# Patient Record
Sex: Female | Born: 1970 | Race: White | Hispanic: No | Marital: Single | Smoking: Never smoker
Health system: Southern US, Community
[De-identification: ages and names within clinical notes are randomized; demographics above are authoritative.]

## PROBLEM LIST (undated history)

## (undated) DIAGNOSIS — M797 Fibromyalgia: Secondary | ICD-10-CM

## (undated) DIAGNOSIS — K219 Gastro-esophageal reflux disease without esophagitis: Secondary | ICD-10-CM

## (undated) DIAGNOSIS — W3400XA Accidental discharge from unspecified firearms or gun, initial encounter: Secondary | ICD-10-CM

## (undated) HISTORY — PX: DIAPHRAGM SURGERY: SHX612

## (undated) HISTORY — PX: TONSILLECTOMY: SUR1361

## (undated) HISTORY — PX: TUBAL LIGATION: SHX77

---

## 2012-04-21 ENCOUNTER — Encounter (HOSPITAL_COMMUNITY): Payer: Self-pay | Admitting: *Deleted

## 2012-04-21 ENCOUNTER — Emergency Department (HOSPITAL_COMMUNITY)
Admission: EM | Admit: 2012-04-21 | Discharge: 2012-04-21 | Disposition: A | Discharge status: Home / Self Care | Payer: Self-pay | Attending: Emergency Medicine | Admitting: Emergency Medicine

## 2012-04-21 DIAGNOSIS — K219 Gastro-esophageal reflux disease without esophagitis: Secondary | ICD-10-CM | POA: Insufficient documentation

## 2012-04-21 DIAGNOSIS — K047 Periapical abscess without sinus: Secondary | ICD-10-CM | POA: Insufficient documentation

## 2012-04-21 HISTORY — DX: Fibromyalgia: M79.7

## 2012-04-21 HISTORY — DX: Accidental discharge from unspecified firearms or gun, initial encounter: W34.00XA

## 2012-04-21 HISTORY — DX: Gastro-esophageal reflux disease without esophagitis: K21.9

## 2012-04-21 MED ORDER — CLINDAMYCIN HCL 150 MG PO CAPS: 150.0000 mg | ORAL_CAPSULE | Freq: Four times a day (QID) | ORAL | Status: AC

## 2012-04-21 MED ORDER — TRAMADOL HCL 50 MG PO TABS: 50.0000 mg | ORAL_TABLET | Freq: Once | ORAL | Status: AC

## 2012-04-21 MED ORDER — TRAMADOL HCL 50 MG PO TABS
50.0000 mg | ORAL_TABLET | Freq: Once | ORAL | Status: AC
  Administered 2012-04-21: 50 mg via ORAL
  Filled 2012-04-21: qty 1

## 2012-04-21 MED ORDER — CLINDAMYCIN HCL 150 MG PO CAPS
300.0000 mg | ORAL_CAPSULE | Freq: Once | ORAL | Status: AC
Start: 2012-04-21 — End: 2012-04-21
  Administered 2012-04-21: 300 mg via ORAL
  Filled 2012-04-21 (×2): qty 1

## 2012-04-21 NOTE — ED Notes (Signed)
Pt c/o right upper dental pain, radiates to lower jaw as well.  Thinks it is an abcess.  Denies fever, n/v
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
## 2012-04-21 NOTE — ED Provider Notes (Signed)
Medical screening examination/treatment/procedure(s) were performed by non-physician practitioner and as supervising physician I was immediately available for consultation/collaboration.  Sunnie Nielsen, MD 04/21/12 [phone]

## 2012-06-04 ENCOUNTER — Emergency Department: Payer: Self-pay | Admitting: Emergency Medicine

## 2012-06-04 LAB — BASIC METABOLIC PANEL WITH GFR
Anion Gap: 5 — ABNORMAL LOW
BUN: 8 mg/dL
Calcium, Total: 8.9 mg/dL
Chloride: 105 mmol/L
Co2: 30 mmol/L
Creatinine: 0.7 mg/dL
EGFR (African American): >60
EGFR (Non-African Amer.): >60
Glucose: 103 mg/dL — ABNORMAL HIGH
Osmolality: 278
Potassium: 3.4 mmol/L — ABNORMAL LOW
Sodium: 140 mmol/L

## 2012-06-04 LAB — CBC
MCH: 28.8 pg (ref 26.0–34.0)
MCV: 85 fL (ref 80–100)
Platelet: 197 10*3/uL (ref 150–440)
RBC: 4.11 10*6/uL (ref 3.80–5.20)

## 2012-06-04 LAB — CK TOTAL AND CKMB (NOT AT ARMC)
CK, Total: 50 U/L
CK-MB: <0.5 ng/mL — ABNORMAL LOW

## 2012-08-21 ENCOUNTER — Emergency Department: Payer: Self-pay | Admitting: Internal Medicine

## 2014-06-25 ENCOUNTER — Emergency Department: Payer: Self-pay | Admitting: Emergency Medicine

## 2015-04-24 ENCOUNTER — Emergency Department
Admission: EM | Admit: 2015-04-24 | Discharge: 2015-04-24 | Disposition: A | Discharge status: Home / Self Care | Payer: Self-pay | Attending: Emergency Medicine | Admitting: Emergency Medicine

## 2015-04-24 ENCOUNTER — Encounter: Payer: Self-pay | Admitting: *Deleted

## 2015-04-24 DIAGNOSIS — Z79899 Other long term (current) drug therapy: Secondary | ICD-10-CM | POA: Insufficient documentation

## 2015-04-24 DIAGNOSIS — X58XXXA Exposure to other specified factors, initial encounter: Secondary | ICD-10-CM | POA: Insufficient documentation

## 2015-04-24 DIAGNOSIS — Y998 Other external cause status: Secondary | ICD-10-CM | POA: Insufficient documentation

## 2015-04-24 DIAGNOSIS — Z880 Allergy status to penicillin: Secondary | ICD-10-CM | POA: Insufficient documentation

## 2015-04-24 DIAGNOSIS — Y9389 Activity, other specified: Secondary | ICD-10-CM | POA: Insufficient documentation

## 2015-04-24 DIAGNOSIS — S46812A Strain of other muscles, fascia and tendons at shoulder and upper arm level, left arm, initial encounter: Secondary | ICD-10-CM | POA: Insufficient documentation

## 2015-04-24 DIAGNOSIS — Y9289 Other specified places as the place of occurrence of the external cause: Secondary | ICD-10-CM | POA: Insufficient documentation

## 2015-04-24 NOTE — ED Notes (Addendum)
Pt has left shoulder pain.  Pt states she woke up with pain in left arm.  No chest pain or sob.  Sx for 5 days.  Pt states it pops when i lift it.

## 2015-04-24 NOTE — Discharge Instructions (Signed)
Muscle Strain A muscle strain is an injury that occurs when a muscle is stretched beyond its normal length. Usually a small number of muscle fibers are torn when this happens. Muscle strain is rated in degrees. First-degree strains have the least amount of muscle fiber tearing and pain. Second-degree and third-degree strains have increasingly more tearing and pain.  Usually, recovery from muscle strain takes 1-2 weeks. Complete healing takes 5-6 weeks.  CAUSES  Muscle strain happens when a sudden, violent force placed on a muscle stretches it too far. This may occur with lifting, sports, or a fall.  RISK FACTORS Muscle strain is especially common in athletes.  SIGNS AND SYMPTOMS At the site of the muscle strain, there may be:  Pain.  Bruising.  Swelling.  Difficulty using the muscle due to pain or lack of normal function. DIAGNOSIS  Your health care provider will perform a physical exam and ask about your medical history. TREATMENT  Often, the best treatment for a muscle strain is resting, icing, and applying cold compresses to the injured area.  HOME CARE INSTRUCTIONS   Use the PRICE method of treatment to promote muscle healing during the first 2-3 days after your injury. The PRICE method involves:  Protecting the muscle from being injured again.  Restricting your activity and resting the injured body part.  Icing your injury. To do this, put ice in a plastic bag. Place a towel between your skin and the bag. Then, apply the ice and leave it on from 15-20 minutes each hour. After the third day, switch to moist heat packs.  Apply compression to the injured area with a splint or elastic bandage. Be careful not to wrap it too tightly. This may interfere with blood circulation or increase swelling.  Elevate the injured body part above the level of your heart as often as you can.  Only take over-the-counter or prescription medicines for pain, discomfort, or fever as directed by your  health care provider.  Warming up prior to exercise helps to prevent future muscle strains. SEEK MEDICAL CARE IF:   You have increasing pain or swelling in the injured area.  You have numbness, tingling, or a significant loss of strength in the injured area. MAKE SURE YOU:   Understand these instructions.  Will watch your condition.  Will get help right away if you are not doing well or get worse. Document Released: 09/21/2005 Document Revised: 07/12/2013 Document Reviewed: 04/20/2013 [facility] Patient Information 2015 [employer], [place]. This information is not intended to replace advice given to you by your health care provider. Make sure you discuss any questions you have with your health care provider.  Take your home meds for spasm and pain. Apply moist heat and cool compresses to the muscles. Consider muscle massage and tissue mobilization using a tennis or racquet ball.
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
## 2016-12-25 ENCOUNTER — Encounter: Payer: Self-pay | Admitting: Medical Oncology

## 2016-12-25 ENCOUNTER — Emergency Department
Admission: EM | Admit: 2016-12-25 | Discharge: 2016-12-25 | Disposition: A | Discharge status: Home / Self Care | Payer: Self-pay | Attending: Emergency Medicine | Admitting: Emergency Medicine

## 2016-12-25 DIAGNOSIS — J111 Influenza due to unidentified influenza virus with other respiratory manifestations: Secondary | ICD-10-CM

## 2016-12-25 DIAGNOSIS — R509 Fever, unspecified: Secondary | ICD-10-CM | POA: Insufficient documentation

## 2016-12-25 DIAGNOSIS — R69 Illness, unspecified: Secondary | ICD-10-CM

## 2016-12-25 DIAGNOSIS — R110 Nausea: Secondary | ICD-10-CM | POA: Insufficient documentation

## 2016-12-25 DIAGNOSIS — R51 Headache: Secondary | ICD-10-CM | POA: Insufficient documentation

## 2016-12-25 MED ORDER — ONDANSETRON HCL 4 MG PO TABS: 4.0000 mg | ORAL_TABLET | Freq: Every day | ORAL | 0 refills | Status: AC | PRN

## 2016-12-25 MED ORDER — FLUTICASONE PROPIONATE 50 MCG/ACT NA SUSP: 2.0000 | Freq: Every day | NASAL | 0 refills | Status: AC
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
## 2016-12-25 NOTE — ED Triage Notes (Signed)
Pt reports fever and body aches that began last night.

## 2016-12-25 NOTE — ED Notes (Addendum)
Patient c/o intermittent stomach pain, congestion, cough, fever, body aches, nausea, diarrhea

## 2017-10-25 ENCOUNTER — Other Ambulatory Visit: Payer: Self-pay

## 2017-10-25 ENCOUNTER — Encounter: Payer: Self-pay | Admitting: Emergency Medicine

## 2017-10-25 ENCOUNTER — Emergency Department
Admission: EM | Admit: 2017-10-25 | Discharge: 2017-10-25 | Disposition: A | Discharge status: Home / Self Care | Payer: Self-pay | Attending: Emergency Medicine | Admitting: Emergency Medicine

## 2017-10-25 DIAGNOSIS — Z79899 Other long term (current) drug therapy: Secondary | ICD-10-CM | POA: Insufficient documentation

## 2017-10-25 DIAGNOSIS — H9201 Otalgia, right ear: Secondary | ICD-10-CM

## 2017-10-25 DIAGNOSIS — H6123 Impacted cerumen, bilateral: Secondary | ICD-10-CM

## 2017-10-25 MED ORDER — CARBAMIDE PEROXIDE 6.5 % OT SOLN
OTIC | Status: AC
  Administered 2017-10-25: 5 [drp] via OTIC
  Filled 2017-10-25: qty 15

## 2017-10-25 MED ORDER — CARBAMIDE PEROXIDE 6.5 % OT SOLN
5.0000 [drp] | Freq: Once | OTIC | Status: AC
  Administered 2017-10-25: 5 [drp] via OTIC
  Filled 2017-10-25: qty 15

## 2017-10-25 MED ORDER — AZITHROMYCIN 250 MG PO TABS: ORAL_TABLET | ORAL | 0 refills | Status: AC

## 2017-10-25 NOTE — ED Triage Notes (Signed)
Intermittent earache x 1 week, became constant since yesterday.
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
## 2017-10-25 NOTE — ED Notes (Signed)
See triage note states she developed intermittent pain to right ear about 1 week ago  States pain is now worse to right ear this am    Afebrile on arrival

## 2018-08-09 ENCOUNTER — Other Ambulatory Visit: Payer: Self-pay

## 2018-08-09 ENCOUNTER — Emergency Department
Admission: EM | Admit: 2018-08-09 | Discharge: 2018-08-09 | Disposition: A | Discharge status: Home / Self Care | Payer: Self-pay | Attending: Emergency Medicine | Admitting: Emergency Medicine

## 2018-08-09 DIAGNOSIS — Z79899 Other long term (current) drug therapy: Secondary | ICD-10-CM | POA: Insufficient documentation

## 2018-08-09 DIAGNOSIS — J0110 Acute frontal sinusitis, unspecified: Secondary | ICD-10-CM | POA: Insufficient documentation

## 2018-08-09 DIAGNOSIS — K047 Periapical abscess without sinus: Secondary | ICD-10-CM | POA: Insufficient documentation

## 2018-08-09 MED ORDER — AMOXICILLIN-POT CLAVULANATE 875-125 MG PO TABS: 1.0000 | ORAL_TABLET | Freq: Two times a day (BID) | ORAL | 0 refills | Status: DC

## 2018-08-09 MED ORDER — AMOXICILLIN-POT CLAVULANATE 875-125 MG PO TABS: 1.0000 | ORAL_TABLET | Freq: Two times a day (BID) | ORAL | 0 refills | Status: AC
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
## 2018-08-09 NOTE — Discharge Instructions (Signed)
OPTIONS FOR DENTAL FOLLOW UP CARE ° °[place] Department of Health and Human Services - Local Safety Net Dental Clinics °http://www.ncdhhs.gov/dph/oralhealth/services/safetynetclinics.htm °  °[facility] (336-562-3123) ° °[facility] [place] ([phone]) ° °[facility] ([phone] ext [phone]) ° °[facility] ([phone]) ° °[facility] ([phone]) °This clinic caters to the indigent population and is on a lottery system. °Location: °[facility], [address], [place] °Clinic Hours: °Wednesdays from 6pm - 9pm, patients seen by a lottery system. °For dates, call or go to [email or web address].[email or web address]/[facility]/patients/[email or web address]-[facility] °Services: °Cleanings, fillings and simple extractions. °Payment Options: °DENTAL WORK IS FREE OF CHARGE. Bring proof of income or support. °Best way to get seen: °Arrive at 5:15 pm - this is a lottery, NOT first come/first serve, so arriving earlier will not increase your chances of being seen. °  °  °[facility] °[phone] °Select option 1 for emergencies °  °Location: °[facility], [address], [place] °Clinic Hours: °No walk-ins accepted - call the day before to schedule an appointment. °Check in times are 9:30 am and 1:30 pm. °Services: °Simple extractions, temporary fillings, pulpectomy/pulp debridement, uncomplicated abscess drainage. °Payment Options: °PAYMENT IS DUE AT THE TIME OF SERVICE.  Fee is usually $100-200, additional surgical procedures (e.g. abscess drainage) may be extra. °Cash, checks, Visa/MasterCard accepted.  Can file Medicaid if patient is covered for dental - patient should call case worker to check. °No discount for [facility] patients. °Best way to get seen: °MUST call the day before and get onto the schedule. Can usually be seen the next 1-2 days. No walk-ins accepted. °  °  °[facility] °[phone] °   °Location: °[facility], [address], [place] °Clinic Hours: °M, W, Th, F 8am or 1:30pm, Tues 9a or 1:30 - first come/first served. °Services: °Simple extractions, temporary fillings, uncomplicated abscess drainage.  You do not need to be an [place] resident. °Payment Options: °PAYMENT IS DUE AT THE TIME OF SERVICE. °Dental insurance, otherwise sliding scale - bring proof of income or support. °Depending on income and treatment needed, cost is usually $50-200. °Best way to get seen: °Arrive early as it is first come/first served. °  °  °[facility] °[phone] °  °Location: °[address] °Clinic Hours: °Mon-Thu 8a-5p °Services: °Most basic dental services including extractions and fillings. °Payment Options: °PAYMENT IS DUE AT THE TIME OF SERVICE. °Sliding scale, up to 50% off - bring proof if income or support. °Medicaid with dental option accepted. °Best way to get seen: °Call to schedule an appointment, can usually be seen within 2 weeks OR they will try to see walk-ins - show up at 8a or 2p (you may have to wait). °  °  °[facility] °[phone] °[place] RESIDENTS ONLY °  °Location: °[facility], [address], [place], [place] [address] °Clinic Hours: By appointment only. °Monday - Thursday 8am-5pm, Friday 8am-12pm °Services: Cleanings, fillings, extractions. °Payment Options: °PAYMENT IS DUE AT THE TIME OF SERVICE. °Cash, Visa or MasterCard. Sliding scale - $30 minimum per service. °Best way to get seen: °Come in to office, complete packet and make an appointment - need proof of income °or support monies for each household member and proof of [place] residence. °Usually takes about a month to get in. °  °  °[facility] °[phone] °  °Location: °[address]   [place] °Clinic Hours: Walk-in Urgent Care Dental Services are offered Monday-Friday  mornings only. °The numbers of emergencies accepted daily is limited to the number of °providers available. °Maximum 15 - Mondays, Wednesdays & Thursdays °Maximum 10 - Tuesdays & Fridays °Services: °You do not need to be a [place] resident to be seen for a dental emergency. °Emergencies are defined as pain, swelling, abnormal bleeding, or dental trauma. Walkins will receive x-rays if needed. °NOTE: Dental cleaning is not an emergency. °Payment Options: °PAYMENT IS DUE AT THE TIME OF SERVICE. °Minimum co-pay is $40.00 for uninsured patients. °Minimum co-pay is $3.00 for Medicaid with dental coverage. °Dental Insurance is accepted and must be presented at time of visit. °Medicare does not cover dental. °Forms of payment: Cash, credit card, checks. °Best way to get seen: °If not previously registered with the clinic, walk-in dental registration begins at 7:15 am and is on a first come/first serve basis. °If previously registered with the clinic, call to make an appointment. °  °  °[facility] °[phone] °[place] RESIDENTS ONLY °  °Location: °[address], [place], [place] °Clinic Hours: °Mon-Thu 10a-2p °Services: Extractions only! °Payment Options: °FREE (donations accepted) - bring proof of income or support °Best way to get seen: °Call and schedule an appointment OR come at 8am on the 1st Monday of every month (except for holidays) when it is first come/first served. °  °  °[facility] °[phone] °  °Location: °[address], [place] °Clinic Hours: °Friday mornings °Services, Payment Options, Best way to get seen: °Call for info °

## 2018-08-09 NOTE — ED Triage Notes (Signed)
Pt c/o having sinus congestion for the past week and now is having left upper mandible/tooth pain for the past 3 days.

## 2019-03-22 ENCOUNTER — Emergency Department
Admission: EM | Admit: 2019-03-22 | Discharge: 2019-03-22 | Disposition: A | Discharge status: Home / Self Care | Payer: Self-pay | Attending: Emergency Medicine | Admitting: Emergency Medicine

## 2019-03-22 ENCOUNTER — Encounter: Payer: Self-pay | Admitting: Emergency Medicine

## 2019-03-22 ENCOUNTER — Other Ambulatory Visit: Payer: Self-pay

## 2019-03-22 DIAGNOSIS — M797 Fibromyalgia: Secondary | ICD-10-CM | POA: Insufficient documentation

## 2019-03-22 DIAGNOSIS — Z79899 Other long term (current) drug therapy: Secondary | ICD-10-CM | POA: Insufficient documentation

## 2019-03-22 DIAGNOSIS — Z880 Allergy status to penicillin: Secondary | ICD-10-CM | POA: Insufficient documentation

## 2019-03-22 DIAGNOSIS — M25512 Pain in left shoulder: Secondary | ICD-10-CM | POA: Insufficient documentation
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
## 2019-03-22 NOTE — ED Notes (Signed)
Patient states having left arm pain that starts in neck area and radiates to the left elbow. Patient denies having any chest pain. Range of motion is normal.

## 2019-03-22 NOTE — ED Notes (Signed)
EDP in with patient 

## 2019-03-22 NOTE — ED Triage Notes (Signed)
Pt c/o left shoulder pain that radiates from neck to upper arm. Pt denies injury. No obvious deformity noted.

## 2020-01-24 ENCOUNTER — Encounter: Payer: Self-pay | Admitting: Emergency Medicine

## 2020-01-24 ENCOUNTER — Other Ambulatory Visit: Payer: Self-pay

## 2020-01-24 DIAGNOSIS — U071 COVID-19: Secondary | ICD-10-CM | POA: Diagnosis not present

## 2020-01-24 DIAGNOSIS — R1032 Left lower quadrant pain: Secondary | ICD-10-CM | POA: Diagnosis present

## 2020-01-24 DIAGNOSIS — K5792 Diverticulitis of intestine, part unspecified, without perforation or abscess without bleeding: Secondary | ICD-10-CM | POA: Diagnosis not present

## 2020-01-24 LAB — COMPREHENSIVE METABOLIC PANEL
ALT: 16 U/L (ref 0–44)
AST: 16 U/L (ref 15–41)
Albumin: 3.7 g/dL (ref 3.5–5.0)
Alkaline Phosphatase: 71 U/L (ref 38–126)
Anion gap: 8 (ref 5–15)
BUN: 11 mg/dL (ref 6–20)
CO2: 25 mmol/L (ref 22–32)
Calcium: 8.6 mg/dL — ABNORMAL LOW (ref 8.9–10.3)
Chloride: 108 mmol/L (ref 98–111)
Creatinine, Ser: 0.58 mg/dL (ref 0.44–1.00)
GFR calc Af Amer: >60 mL/min (ref >60)
GFR calc non Af Amer: >60 mL/min (ref >60)
Glucose, Bld: 161 mg/dL — ABNORMAL HIGH (ref 70–99)
Potassium: 3.5 mmol/L (ref 3.5–5.1)
Sodium: 141 mmol/L (ref 135–145)
Total Bilirubin: 0.9 mg/dL (ref 0.3–1.2)
Total Protein: 7 g/dL (ref 6.5–8.1)

## 2020-01-24 LAB — LIPASE, BLOOD: Lipase: 19 U/L (ref 11–51)

## 2020-01-24 LAB — URINALYSIS, COMPLETE (UACMP) WITH MICROSCOPIC
Bacteria, UA: NONE SEEN
Bilirubin Urine: NEGATIVE
Glucose, UA: NEGATIVE mg/dL
Hgb urine dipstick: NEGATIVE
Ketones, ur: NEGATIVE mg/dL
Leukocytes,Ua: NEGATIVE
Nitrite: NEGATIVE
Protein, ur: NEGATIVE mg/dL
Specific Gravity, Urine: 1.016 (ref 1.005–1.030)
pH: 7 (ref 5.0–8.0)

## 2020-01-24 LAB — CBC WITH DIFFERENTIAL/PLATELET
Abs Immature Granulocytes: 0.08 10*3/uL — ABNORMAL HIGH (ref 0.00–0.07)
Basophils Absolute: 0 10*3/uL (ref 0.0–0.1)
Basophils Relative: 0 %
Eosinophils Absolute: 0.2 10*3/uL (ref 0.0–0.5)
Eosinophils Relative: 2 %
HCT: 35 % — ABNORMAL LOW (ref 36.0–46.0)
Hemoglobin: 11.2 g/dL — ABNORMAL LOW (ref 12.0–15.0)
Immature Granulocytes: 1 %
Lymphocytes Relative: 14 %
Lymphs Abs: 1.3 10*3/uL (ref 0.7–4.0)
MCH: 26.7 pg (ref 26.0–34.0)
MCHC: 32 g/dL (ref 30.0–36.0)
MCV: 83.5 fL (ref 80.0–100.0)
Monocytes Absolute: 0.7 10*3/uL (ref 0.1–1.0)
Monocytes Relative: 7 %
Neutro Abs: 7.2 10*3/uL (ref 1.7–7.7)
Neutrophils Relative %: 76 %
Platelets: 198 10*3/uL (ref 150–400)
RBC: 4.19 MIL/uL (ref 3.87–5.11)
RDW: 13.8 % (ref 11.5–15.5)
WBC: 9.5 10*3/uL (ref 4.0–10.5)
nRBC: 0 % (ref 0.0–0.2)

## 2020-01-24 LAB — POCT PREGNANCY, URINE: Preg Test, Ur: NEGATIVE

## 2020-01-24 NOTE — ED Triage Notes (Signed)
Patient ambulatory to triage with steady gait, without difficulty or distress noted, mask in place; pt st since last wk having sinus pressure/drainage; thru video visit, rx doxycycline with relief of symptoms ; pn Tuesday began having left lower abd pain "like gas buildup" some nausea

## 2020-01-25 ENCOUNTER — Emergency Department: Payer: PRIVATE HEALTH INSURANCE

## 2020-01-25 ENCOUNTER — Encounter: Payer: Self-pay | Admitting: Radiology

## 2020-01-25 ENCOUNTER — Emergency Department
Admission: EM | Admit: 2020-01-25 | Discharge: 2020-01-25 | Disposition: A | Discharge status: Home / Self Care | Payer: PRIVATE HEALTH INSURANCE | Attending: Emergency Medicine | Admitting: Emergency Medicine

## 2020-01-25 DIAGNOSIS — U071 COVID-19: Secondary | ICD-10-CM

## 2020-01-25 DIAGNOSIS — K5792 Diverticulitis of intestine, part unspecified, without perforation or abscess without bleeding: Secondary | ICD-10-CM

## 2020-01-25 LAB — RESPIRATORY PANEL BY RT PCR (FLU A&B, COVID)
Influenza A by PCR: NEGATIVE
Influenza B by PCR: NEGATIVE
SARS Coronavirus 2 by RT PCR: POSITIVE — AB

## 2020-01-25 LAB — POC SARS CORONAVIRUS 2 AG -  ED: SARS Coronavirus 2 Ag: NEGATIVE

## 2020-01-25 MED ORDER — IOHEXOL 300 MG/ML  SOLN
100.0000 mL | Freq: Once | INTRAMUSCULAR | Status: AC | PRN
  Administered 2020-01-25: 02:00:00 100 mL via INTRAVENOUS

## 2020-01-25 MED ORDER — AMOXICILLIN-POT CLAVULANATE 875-125 MG PO TABS: 1.0000 | ORAL_TABLET | Freq: Two times a day (BID) | ORAL | 0 refills | Status: DC

## 2020-01-25 MED ORDER — AMOXICILLIN-POT CLAVULANATE 875-125 MG PO TABS: 1.0000 | ORAL_TABLET | Freq: Two times a day (BID) | ORAL | 0 refills | Status: AC

## 2020-01-25 MED ORDER — AMOXICILLIN-POT CLAVULANATE 875-125 MG PO TABS
1.0000 | ORAL_TABLET | Freq: Once | ORAL | Status: AC
  Administered 2020-01-25: 04:00:00 1 via ORAL
  Filled 2020-01-25: qty 1
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
## 2020-01-26 ENCOUNTER — Telehealth: Payer: Self-pay | Admitting: Adult Health

## 2020-01-26 ENCOUNTER — Telehealth: Payer: Self-pay | Admitting: Physician Assistant

## 2020-01-26 NOTE — Telephone Encounter (Signed)
Reviewed with patient her eligibility to receive monoclonal antibody therapy for her recent covid 19 infection, as she is in a high risk category due to her BMI.  I explained how the treatment works in detail to reduce her risk of hospitalization and/or complications.  Patient will call us if she decides to receive treatment.    Lillard Anes, NP

## 2020-01-26 NOTE — Telephone Encounter (Signed)
Called to discuss with patient about Covid symptoms and the use of bamlanivimab/etesevimab or casirivimab/imdevimab, a monoclonal antibody infusion for those with mild to moderate Covid symptoms and at a high risk of hospitalization.  Pt is qualified for this infusion at the [facility] due to BMI>35   Message left to call back and sent mychart message  Raivyn Kabler PA-C  [facility]    

## 2021-02-04 ENCOUNTER — Other Ambulatory Visit: Payer: Self-pay | Admitting: Physician Assistant

## 2021-02-04 DIAGNOSIS — Z1231 Encounter for screening mammogram for malignant neoplasm of breast: Secondary | ICD-10-CM

## 2021-11-03 IMAGING — CT CT ABD-PELV W/ CM
2 of 5 series · 16 of 46 positions shown, 18 images · IV contrast (APPLIED)
Comparison: None.

CLINICAL DATA: Left lower quadrant pain

EXAM:
CT ABDOMEN AND PELVIS WITH CONTRAST
TECHNIQUE: Multidetector CT imaging of the abdomen and pelvis was performed
using the standard protocol following bolus administration of
intravenous contrast.
CONTRAST:  100mL OMNIPAQUE IOHEXOL 300 MG/ML  SOLN

[Series 2: axial st · axial · 0.71mm/px · z∈[-943,-523]mm · 13 of 96 slices shown, 15 images]
[im 6/96  soft-tissue]
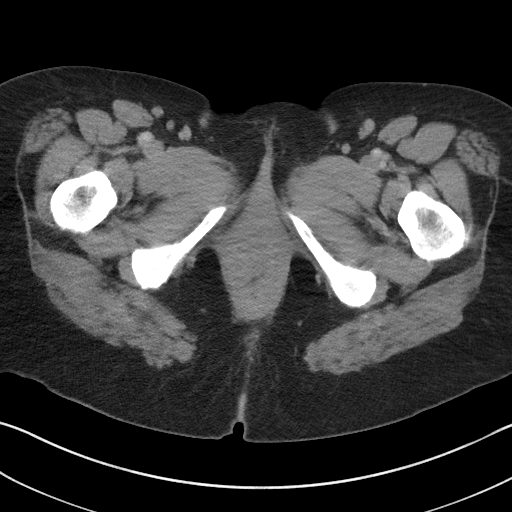
[im 6/96  bone]
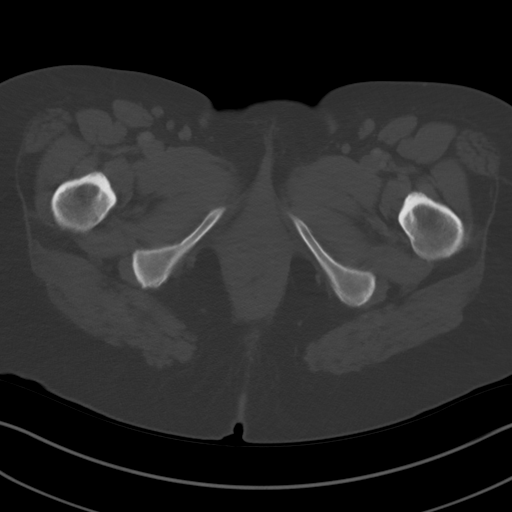
[im 11/96  soft-tissue]
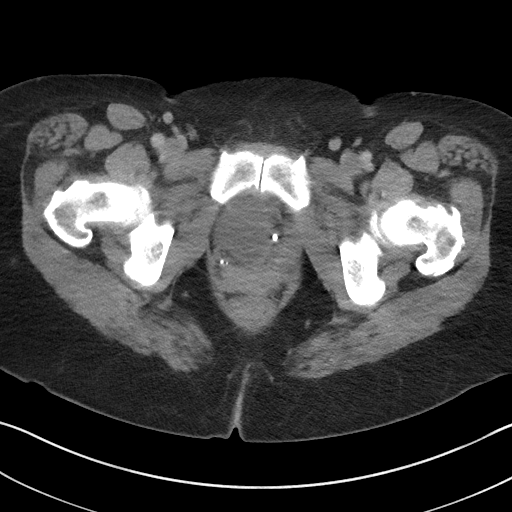
[im 22/96  soft-tissue]
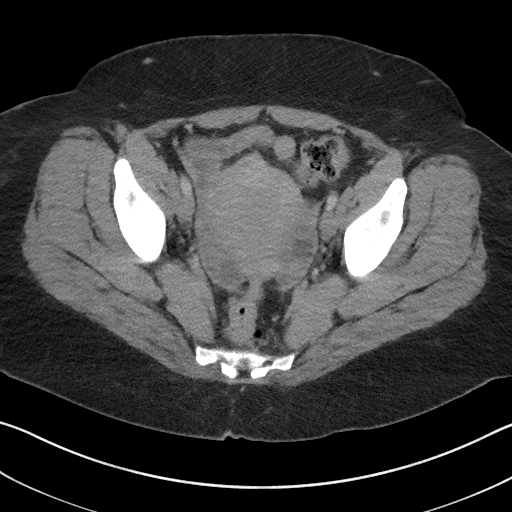
[im 27/96  soft-tissue]
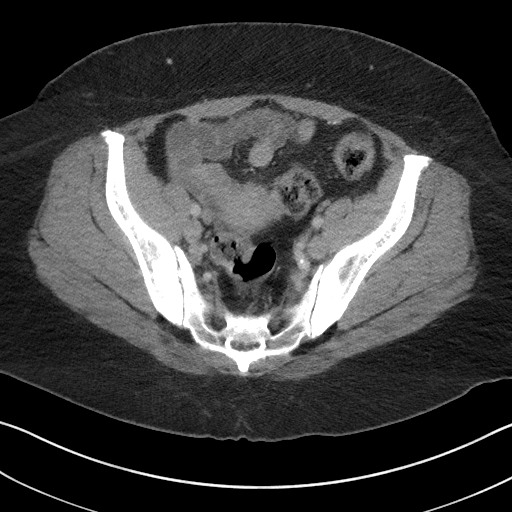
[im 32/96  soft-tissue]
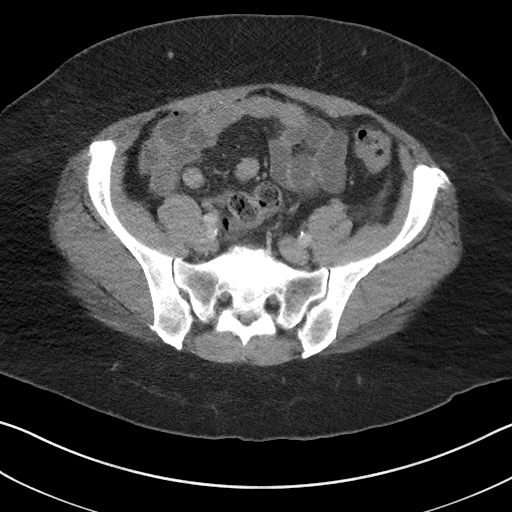
[im 43/96  soft-tissue]
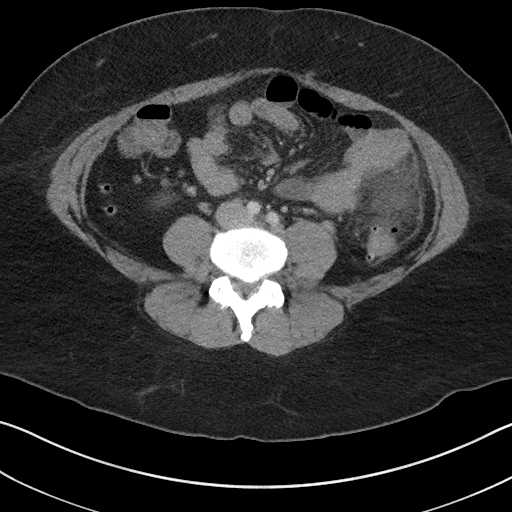
[im 48/96  soft-tissue]
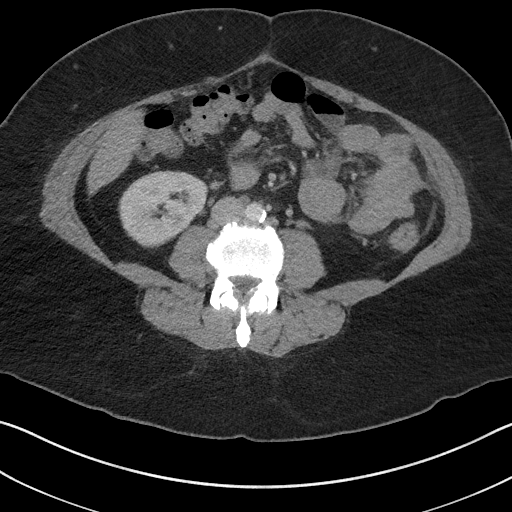
[im 53/96  soft-tissue]
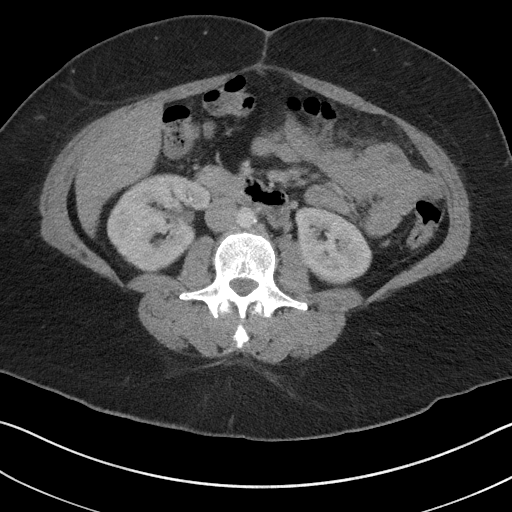
[im 64/96  soft-tissue]
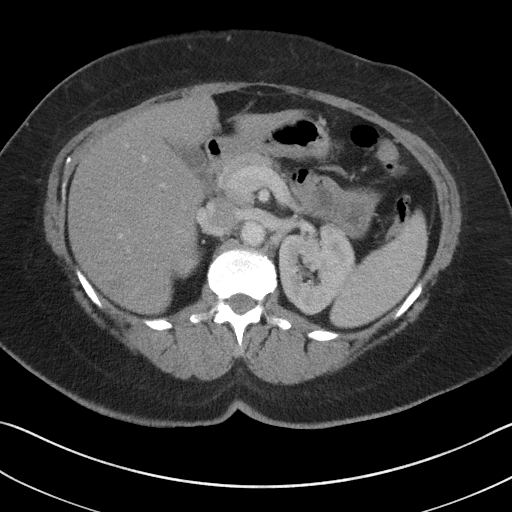
[im 64/96  bone]
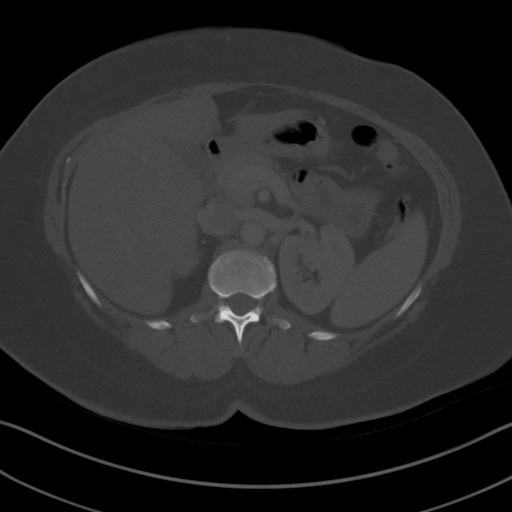
[im 69/96  soft-tissue]
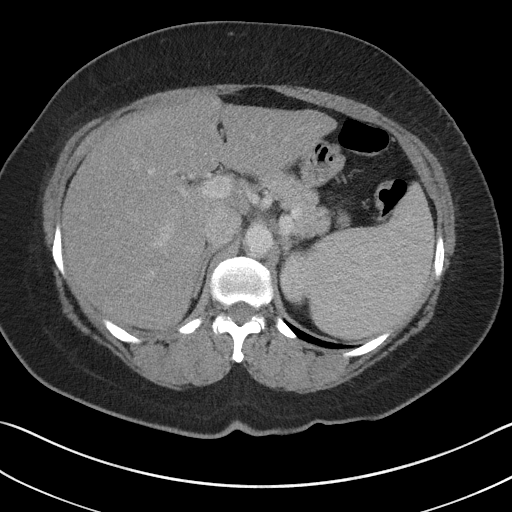
[im 74/96  soft-tissue]
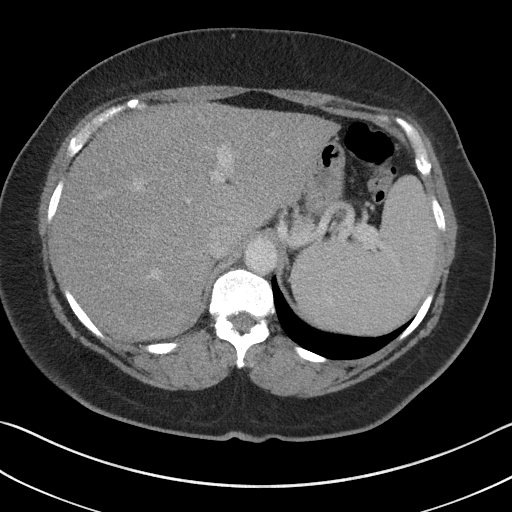
[im 85/96  soft-tissue]
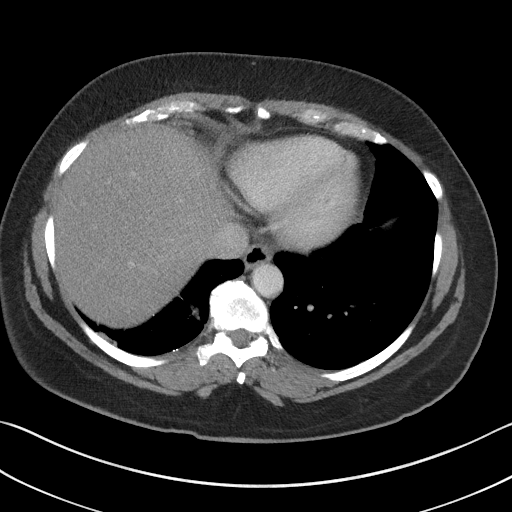
[im 90/96  soft-tissue]
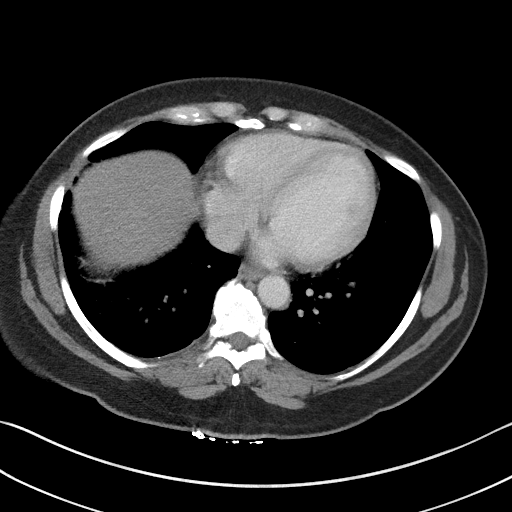

[Series 5: coronal st · coronal · 0.74mm/px · 3 of 98 slices shown]
[im 33/98  soft-tissue]
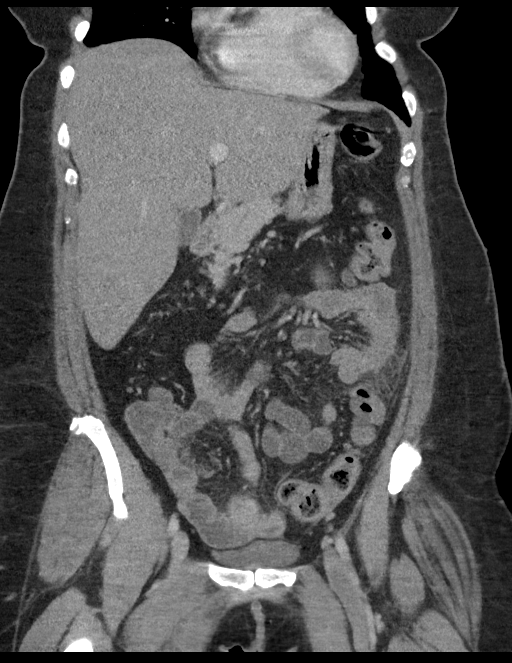
[im 44/98  soft-tissue]
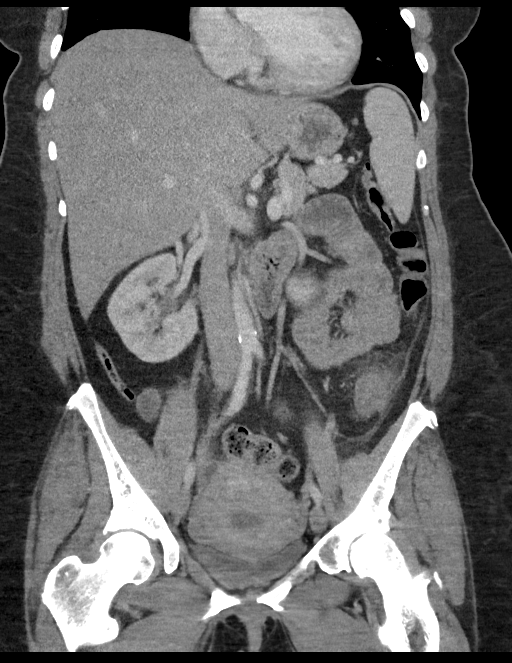
[im 54/98  soft-tissue]
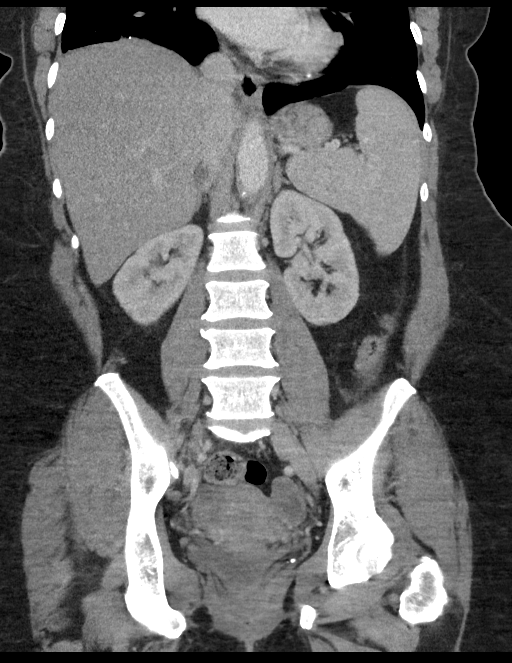

[16 of 46 positions shown; findings below may reference images not displayed]

FINDINGS: Lower chest: Patchy ground-glass airspace opacities seen in the
lower lungs. No effusions.

Hepatobiliary: Diffuse low-density throughout the liver compatible
with fatty infiltration. No focal abnormality. Gallbladder
unremarkable.

Pancreas: No focal abnormality or ductal dilatation.

Spleen: No focal abnormality.  Normal size.

Adrenals/Urinary Tract: No adrenal abnormality. No focal renal
abnormality. No stones or hydronephrosis. Urinary bladder is
unremarkable.

Stomach/Bowel: Descending colonic and sigmoid diverticulosis.
Inflammatory stranding noted around the distal descending colon
compatible with active diverticulitis. Normal appendix. Stomach and
small bowel decompressed, unremarkable.

Vascular/Lymphatic: Aortic atherosclerosis. No enlarged abdominal or
pelvic lymph nodes.

Reproductive: Uterus and adnexa unremarkable.  No mass.

Other: No free fluid or free air.

Musculoskeletal: No acute bony abnormality.
IMPRESSION: Few scattered left colonic diverticula. Inflammatory stranding in
the distal descending colon compatible with active acute
diverticulitis.

Fatty infiltration of the liver.

Aortic atherosclerosis.

Patchy ground-glass opacities visualized in the lower lungs.
Recommend clinical correlation for possible pneumonia, including
COVID pneumonia.

## 2021-11-28 ENCOUNTER — Emergency Department: Payer: PRIVATE HEALTH INSURANCE

## 2021-11-28 ENCOUNTER — Emergency Department
Admission: EM | Admit: 2021-11-28 | Discharge: 2021-11-28 | Disposition: A | Discharge status: Home / Self Care | Payer: PRIVATE HEALTH INSURANCE | Attending: Emergency Medicine | Admitting: Emergency Medicine

## 2021-11-28 ENCOUNTER — Other Ambulatory Visit: Payer: Self-pay

## 2021-11-28 DIAGNOSIS — S93401A Sprain of unspecified ligament of right ankle, initial encounter: Secondary | ICD-10-CM | POA: Diagnosis not present

## 2021-11-28 DIAGNOSIS — X501XXA Overexertion from prolonged static or awkward postures, initial encounter: Secondary | ICD-10-CM | POA: Diagnosis not present

## 2021-11-28 DIAGNOSIS — M79671 Pain in right foot: Secondary | ICD-10-CM | POA: Diagnosis present

## 2021-11-28 NOTE — ED Triage Notes (Signed)
Pt to ED via POV from home. Pt reports rolling her right ankle coming off some stairs yesterday. Pt sensation intact and pulse palpable.

## 2021-11-28 NOTE — ED Provider Notes (Signed)
[facility] Provider Note    Event Date/Time   First MD Initiated Contact with Patient 11/28/21 1758     (approximate)   History   Chief Complaint Foot Pain (Right)   HPI Maria Harrell is a 51 y.o. female, history of fibromyalgia, GERD, presents the emergency department for evaluation of foot pain.  Patient states that she rolled her ankle coming down some stairs yesterday.  Currently endorsing mild pain diffusely around the ankle, however she states that she is still currently able to walk on it.  Denies fever/chills, numbness/tingling in lower extremity, lower leg pain, knee pain, thigh pain, or hip pain.  History Limitations: No limitations.      Physical Exam  Triage Vital Signs: ED Triage Vitals  Enc Vitals Group     BP 11/28/21 1647 (!) 157/91     Pulse Rate 11/28/21 1647 73     Resp 11/28/21 1647 18     Temp 11/28/21 1647 98.4 F (36.9 C)     Temp Source 11/28/21 1647 Oral     SpO2 11/28/21 1647 99 %     Weight 11/28/21 1644 216 lb (98 kg)     Height 11/28/21 1644 5\' 3"  (1.6 m)     Head Circumference --      Peak Flow --      Pain Score 11/28/21 1644 2     Pain Loc --      Pain Edu? --      Excl. in GC? --     Most recent vital signs: Vitals:   11/28/21 1647  BP: (!) 157/91  Pulse: 73  Resp: 18  Temp: 98.4 F (36.9 C)  SpO2: 99%    General: Awake, NAD.  CV: Good peripheral perfusion.  Resp: Normal effort.  Abd: Soft, non-tender. No distention.  Neuro: At baseline. No gross neurological deficits. Other: Moderate diffuse swelling around the ankle joint.  No bony tenderness.  Normal range of motion.  Pulse, motor, sensation intact.  No warmth or erythema.  Patient is able to ambulate across the room with minimal difficulty.  Physical Exam    ED Results / Procedures / Treatments  Labs (all labs ordered are listed, but only abnormal results are displayed) Labs Reviewed - No data to display   EKG Not  applicable   RADIOLOGY  ED Provider Interpretation: I personally reviewed and interpreted these x-rays, no evidence of acute fracture or dislocation.  DG Ankle Complete Right  Result Date: 11/28/2021 CLINICAL DATA:  Status post fall. EXAM: RIGHT ANKLE - COMPLETE 3+ VIEW COMPARISON:  None. FINDINGS: There is no evidence of fracture, dislocation, or joint effusion. There is a large plantar calcaneal spur. Mild degenerative changes are seen along the dorsal aspect of the proximal to mid right foot. Moderate to marked severity anterior and lateral soft tissue swelling is seen. IMPRESSION: 1. Moderate to marked severity anterior and lateral soft tissue swelling without evidence of acute fracture. 2. Mild degenerative changes, as described above. Electronically Signed   By: 11/30/2021 M.D.   On: 11/28/2021 17:25   DG Foot Complete Right  Result Date: 11/28/2021 CLINICAL DATA:  Status post fall. EXAM: RIGHT FOOT COMPLETE - 3+ VIEW COMPARISON:  None. FINDINGS: There is no evidence of fracture or dislocation. There is a large plantar calcaneal spur. Mild degenerative changes are noted along the dorsal aspect of the mid right foot. Moderate severity soft tissue swelling is seen along the anterolateral aspect of the right ankle.  IMPRESSION: 1. No acute fracture or dislocation. 2. Large plantar calcaneal spur. 3. Moderate severity soft tissue swelling along the anterolateral aspect of the right ankle. Electronically Signed   By: Aram Candela M.D.   On: 11/28/2021 17:26    PROCEDURES:  Critical Care performed: None.  Procedures    MEDICATIONS ORDERED IN ED: Medications - No data to display   IMPRESSION / MDM / ASSESSMENT AND PLAN / ED COURSE  I reviewed the triage vital signs and the nursing notes.                              Maria Harrell is a 51 y.o. female, history of fibromyalgia, GERD, presents the emergency department for evaluation of foot pain.  Patient states that she rolled  her ankle coming down some stairs yesterday.  Currently endorsing mild pain diffusely around the ankle, however she states that she is still currently able to walk on it.   Differential diagnosis includes, but is not limited to, ankle sprain, medial/lateral malleolus fracture, metatarsal fracture,   ED Course Patient appears well.  Vital signs within normal limits.  NAD.  X-rays negative for acute fracture/dislocation.  Soft tissue swelling present  Assessment/Plan History, physical exam, and work-up consistent with ankle sprain.  Imaging reassuring for no evidence of fracture/dislocation.  Patient is able to ambulate well with minimal difficulty.  Low suspicion for occult fracture.  Physical exam reassuring for no major ligament disruptions.  Advised patient to rest and ice for the next few days, as well as apply ace bandage regularly for swelling. We will plan to discharge this patient.  Patient was provided with anticipatory guidance, return precautions, and educational material. Encouraged the patient to return to the emergency department at any time if they begin to experience any new or worsening symptoms.       FINAL CLINICAL IMPRESSION(S) / ED DIAGNOSES   Final diagnoses:  Sprain of right ankle, unspecified ligament, initial encounter     Rx / DC Orders   ED Discharge Orders     None        Note:  This document was prepared using Dragon voice recognition software and may include unintentional dictation errors.   Varney Daily, [place] 11/28/21 1959    Gilles Chiquito, MD 11/28/21 [phone]

## 2021-11-28 NOTE — ED Notes (Signed)
Patient declined discharge instructions.

## 2021-11-28 NOTE — Discharge Instructions (Addendum)
-  Treat pain with Tylenol/ibuprofen as needed -Allow ankle to rest.  Ice for the first 48 hours. -Continue to apply Ace bandage for the first week to reduce swelling. -Return to the emergency department anytime if you begin to experience any new or worsening symptoms

## 2022-09-28 ENCOUNTER — Emergency Department: Payer: PRIVATE HEALTH INSURANCE

## 2022-09-28 ENCOUNTER — Emergency Department
Admission: EM | Admit: 2022-09-28 | Discharge: 2022-09-28 | Disposition: A | Discharge status: Home / Self Care | Payer: PRIVATE HEALTH INSURANCE | Attending: Emergency Medicine | Admitting: Emergency Medicine

## 2022-09-28 DIAGNOSIS — R0789 Other chest pain: Secondary | ICD-10-CM | POA: Insufficient documentation

## 2022-09-28 DIAGNOSIS — E876 Hypokalemia: Secondary | ICD-10-CM | POA: Diagnosis not present

## 2022-09-28 LAB — COMPREHENSIVE METABOLIC PANEL
ALT: 13 U/L (ref 0–44)
AST: 18 U/L (ref 15–41)
Albumin: 4 g/dL (ref 3.5–5.0)
Alkaline Phosphatase: 81 U/L (ref 38–126)
Anion gap: 7 (ref 5–15)
BUN: 13 mg/dL (ref 6–20)
CO2: 26 mmol/L (ref 22–32)
Calcium: 8.6 mg/dL — ABNORMAL LOW (ref 8.9–10.3)
Chloride: 105 mmol/L (ref 98–111)
Creatinine, Ser: 0.61 mg/dL (ref 0.44–1.00)
GFR, Estimated: >60 mL/min (ref >60)
Glucose, Bld: 189 mg/dL — ABNORMAL HIGH (ref 70–99)
Potassium: 3.2 mmol/L — ABNORMAL LOW (ref 3.5–5.1)
Sodium: 138 mmol/L (ref 135–145)
Total Bilirubin: 0.8 mg/dL (ref 0.3–1.2)
Total Protein: 7.6 g/dL (ref 6.5–8.1)

## 2022-09-28 LAB — CBC WITH DIFFERENTIAL/PLATELET
Abs Immature Granulocytes: 0.04 10*3/uL (ref 0.00–0.07)
Basophils Absolute: 0.1 10*3/uL (ref 0.0–0.1)
Basophils Relative: 1 %
Eosinophils Absolute: 0.3 10*3/uL (ref 0.0–0.5)
Eosinophils Relative: 4 %
HCT: 38.3 % (ref 36.0–46.0)
Hemoglobin: 11.8 g/dL — ABNORMAL LOW (ref 12.0–15.0)
Immature Granulocytes: 1 %
Lymphocytes Relative: 29 %
Lymphs Abs: 1.8 10*3/uL (ref 0.7–4.0)
MCH: 26.2 pg (ref 26.0–34.0)
MCHC: 30.8 g/dL (ref 30.0–36.0)
MCV: 85.1 fL (ref 80.0–100.0)
Monocytes Absolute: 0.5 10*3/uL (ref 0.1–1.0)
Monocytes Relative: 9 %
Neutro Abs: 3.6 10*3/uL (ref 1.7–7.7)
Neutrophils Relative %: 56 %
Platelets: 219 10*3/uL (ref 150–400)
RBC: 4.5 MIL/uL (ref 3.87–5.11)
RDW: 15 % (ref 11.5–15.5)
WBC: 6.3 10*3/uL (ref 4.0–10.5)
nRBC: 0 % (ref 0.0–0.2)

## 2022-09-28 LAB — TROPONIN I (HIGH SENSITIVITY)
Troponin I (High Sensitivity): 4 ng/L (ref <18)
Troponin I (High Sensitivity): 10 ng/L (ref <18)

## 2022-09-28 MED ORDER — POTASSIUM CHLORIDE CRYS ER 20 MEQ PO TBCR
40.0000 meq | EXTENDED_RELEASE_TABLET | Freq: Once | ORAL | Status: AC
  Administered 2022-09-28: 40 meq via ORAL
  Filled 2022-09-28: qty 2

## 2022-09-28 NOTE — ED Notes (Signed)
Pt Dc to home. Pt noted to be ambulatory out of lobby after registration

## 2022-09-28 NOTE — ED Provider Notes (Signed)
[facility] Provider Note    None    (approximate)   History   Chest Pain   HPI  Maria Harrell is a 51 y.o. female who presents to the ED for evaluation of Chest Pain   Patient without cardiac history presents to the ED for evaluation of a few episodes of sharp and burning chest pain on the left side since last night around 8 PM.  Each episode lasts a few minutes at a time and then self resolves.  Reports a total of 5 or 6 episodes.  She reports she feels better when she gets upright and walks around and exerts herself and the pain resolves.  No dyspnea.  No trauma, rash, fever, cough  Physical Exam   Triage Vital Signs: ED Triage Vitals  Enc Vitals Group     BP 09/28/22 0222 (!) 155/85     Pulse Rate 09/28/22 0222 94     Resp 09/28/22 0222 20     Temp 09/28/22 0222 97.7 F (36.5 C)     Temp Source 09/28/22 0222 Oral     SpO2 09/28/22 0222 100 %     Weight 09/28/22 0216 215 lb (97.5 kg)     Height 09/28/22 0216 5\' 3"  (1.6 m)     Head Circumference --      Peak Flow --      Pain Score --      Pain Loc --      Pain Edu? --      Excl. in [place]? --     Most recent vital signs: Vitals:   09/28/22 0446 09/28/22 0543  BP:  116/62  Pulse: 89 66  Resp: 18 17  Temp:  98.2 F (36.8 C)  SpO2:  100%    General: Awake, no distress.  CV:  Good peripheral perfusion.  Resp:  Normal effort.  Abd:  No distention.  MSK:  No deformity noted.  Tender to palpation over the area of concern without overlying skin changes or signs of trauma. Neuro:  No focal deficits appreciated. Other:     ED Results / Procedures / Treatments   Labs (all labs ordered are listed, but only abnormal results are displayed) Labs Reviewed  CBC WITH DIFFERENTIAL/PLATELET - Abnormal; Notable for the following components:      Result Value   Hemoglobin 11.8 (*)    All other components within normal limits  COMPREHENSIVE METABOLIC PANEL - Abnormal; Notable for the following  components:   Potassium 3.2 (*)    Glucose, Bld 189 (*)    Calcium 8.6 (*)    All other components within normal limits  TROPONIN I (HIGH SENSITIVITY)  TROPONIN I (HIGH SENSITIVITY)    EKG Sinus rhythm with a rate of 92 bpm.  Normal axis and intervals.  Nonspecific ST changes inferiorly and laterally without STEMI.  No recent comparison  RADIOLOGY CXR interpreted by me without evidence of acute cardiopulmonary pathology.  Official radiology report(s): DG Chest Portable 1 View  Result Date: 09/28/2022 CLINICAL DATA:  Chest pain. EXAM: PORTABLE CHEST 1 VIEW COMPARISON:  Chest radiograph dated 06/04/2012. FINDINGS: No focal consolidation, pleural effusion, or pneumothorax. The cardiac silhouette is within normal limits. No acute osseous pathology. Similar positioning of a radiopaque focus in the right upper abdomen. IMPRESSION: No active disease. Electronically Signed   By: Anner Crete M.D.   On: 09/28/2022 02:38    PROCEDURES and INTERVENTIONS:  .1-3 Lead EKG Interpretation  Performed by: Vladimir Crofts,  MD Authorized by: Delton Prairie, MD     Interpretation: normal     ECG rate:  70   ECG rate assessment: normal     Rhythm: sinus rhythm     Ectopy: none     Conduction: normal     Medications  potassium chloride SA (KLOR-CON M) CR tablet 40 mEq (40 mEq Oral Given 09/28/22 0442)     IMPRESSION / MDM / ASSESSMENT AND PLAN / ED COURSE  I reviewed the triage vital signs and the nursing notes.  Differential diagnosis includes, but is not limited to, ACS, PTX, PNA, muscle strain/spasm, PE, dissection  {Patient presents with symptoms of an acute illness or injury that is potentially life-threatening.  51 year old female presents to the ED with atypical chest pains, possibly MSK of etiology and suitable for outpatient management.  Nonischemic EKG and 2 negative troponins.  CXR is clear.  Metabolic panel with hypokalemia and CBC without leukocytosis.  I considered observation  admission for this patient, though I think cardiac etiology is less likely.  Discharged with close return precautions.  Clinical Course as of 09/28/22 0655  Mon Sep 28, 2022  0226 A few episodes on and off since 8p, lasting a few minutes at a time. Maybe 6 episodes total, improved with exertion [DS]  0600 Reassessed.  Feeling well.  No more episodes of pain after we have repleted the potassium.  We discussed benign workup and possible etiologies of her symptoms. [DS]    Clinical Course User Index [DS] Delton Prairie, MD     FINAL CLINICAL IMPRESSION(S) / ED DIAGNOSES   Final diagnoses:  Other chest pain  Hypokalemia     Rx / DC Orders   ED Discharge Orders     None        Note:  This document was prepared using Dragon voice recognition software and may include unintentional dictation errors.   Delton Prairie, MD 09/28/22 [phone]

## 2022-09-28 NOTE — ED Triage Notes (Signed)
To triage with [facility]. Ate dinner and had tea around 7pm. Has been having chest tingling and pressure since. Denies N/V/D.

## 2023-09-07 IMAGING — CR DG ANKLE COMPLETE 3+V*R*
1 series · 3 of 3 positions shown · non-contrast
Comparison: None.

CLINICAL DATA: Status post fall.

EXAM:
RIGHT ANKLE - COMPLETE 3+ VIEW

[Series 1: dg ankle complete right · 0.14mm/px · 3 of 3 slices shown]
[im 1/3]
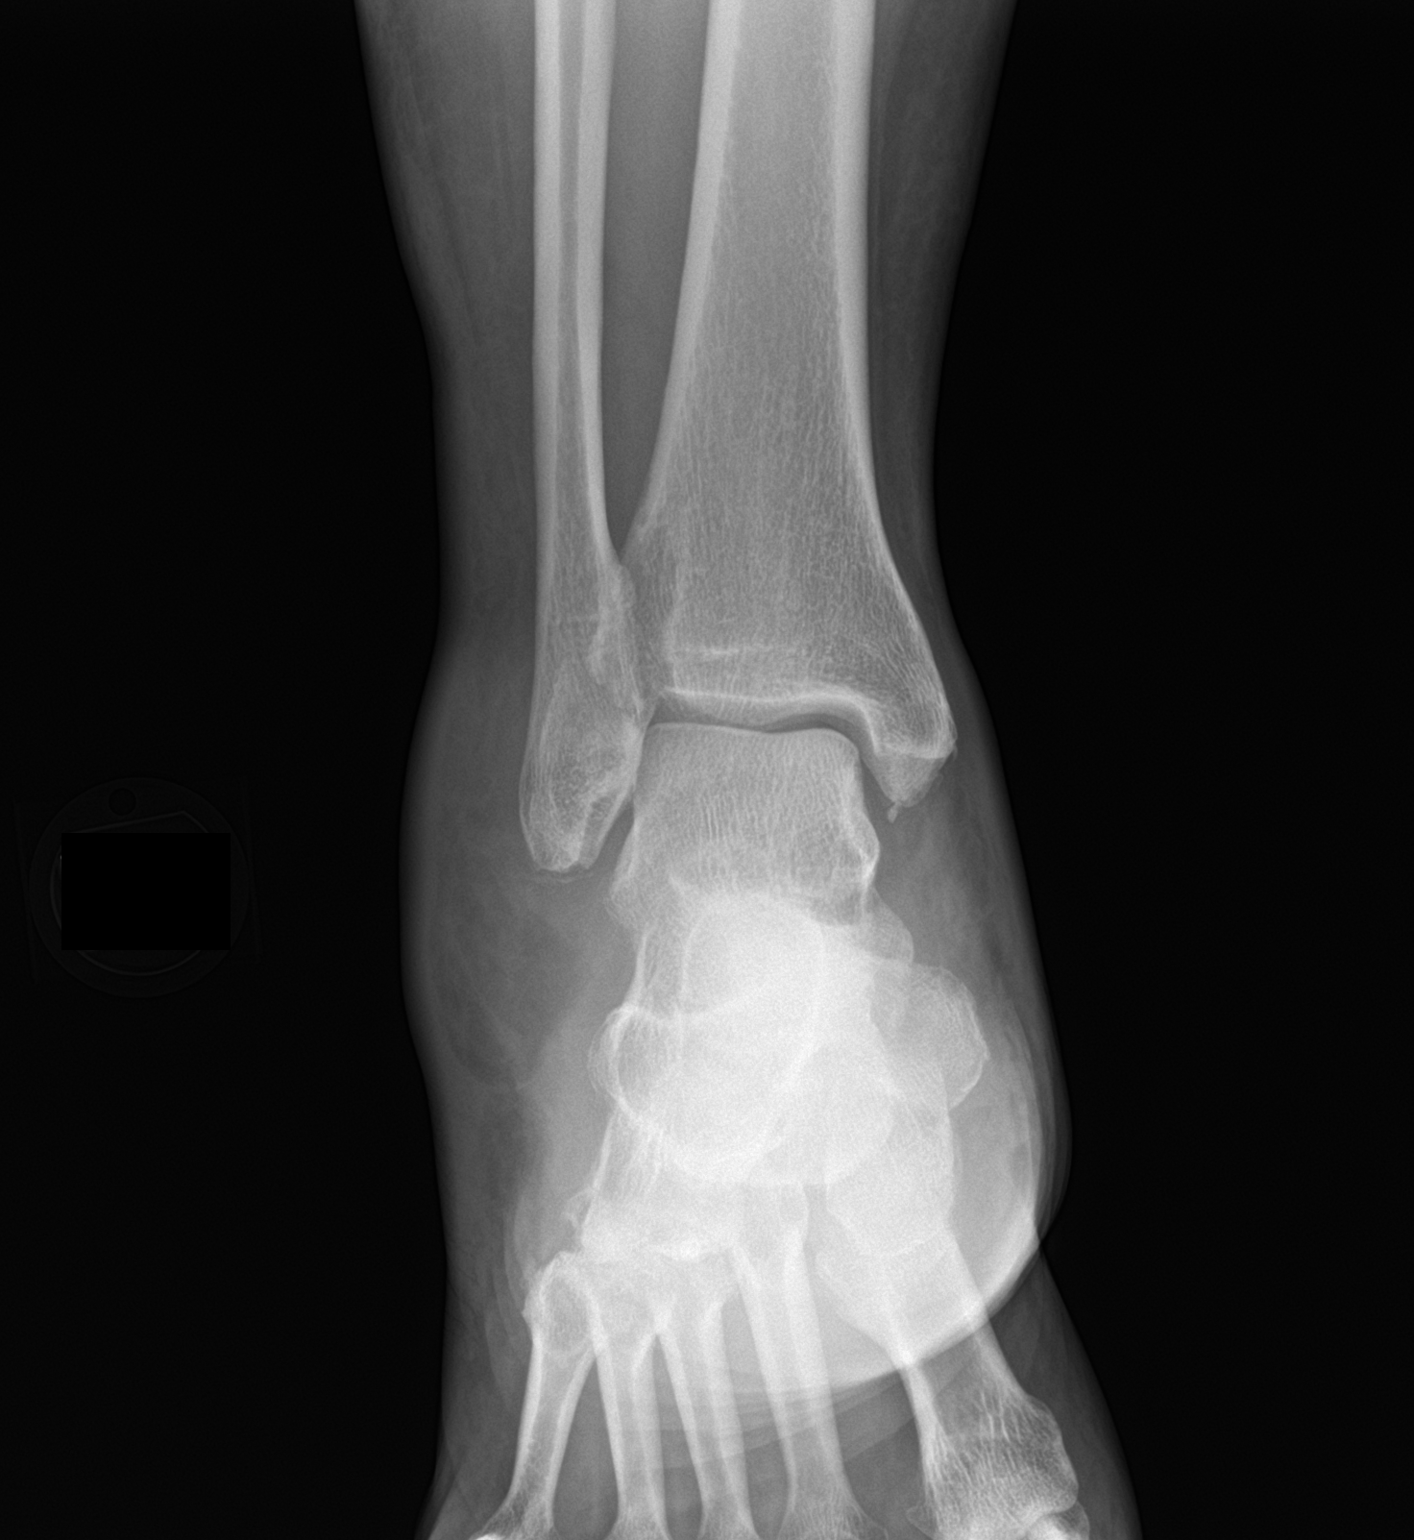
[im 2/3]
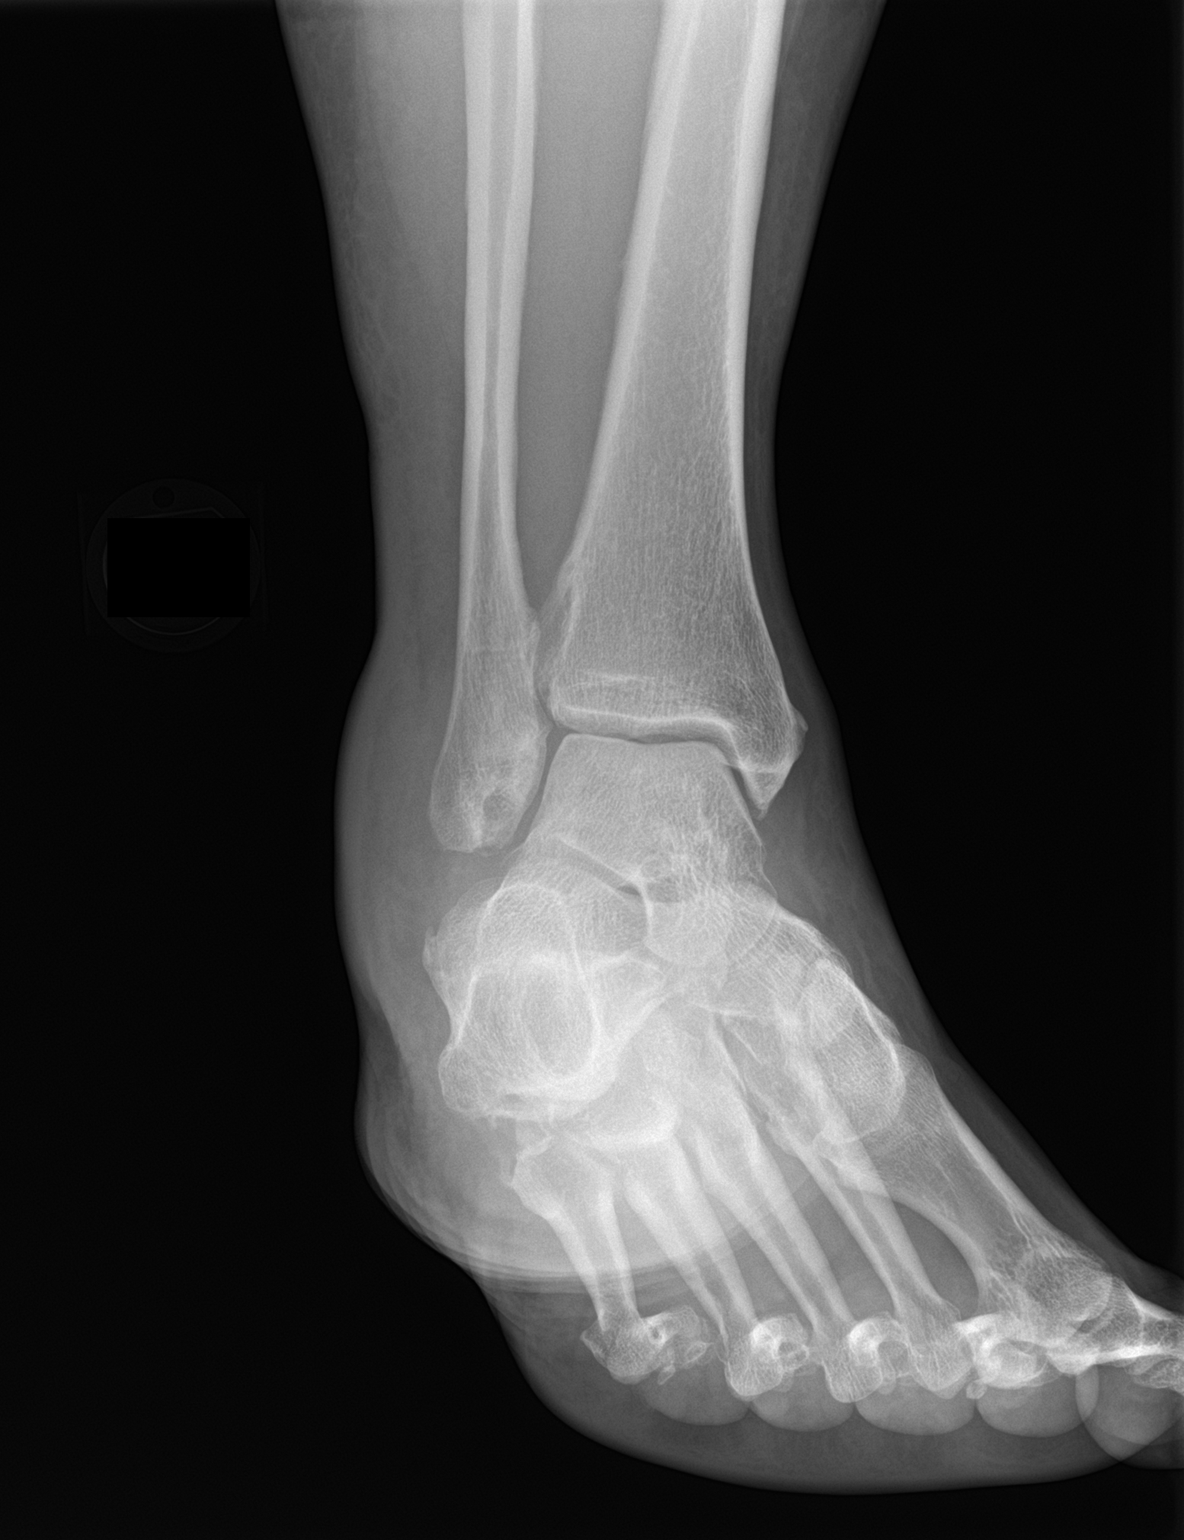
[im 3/3]
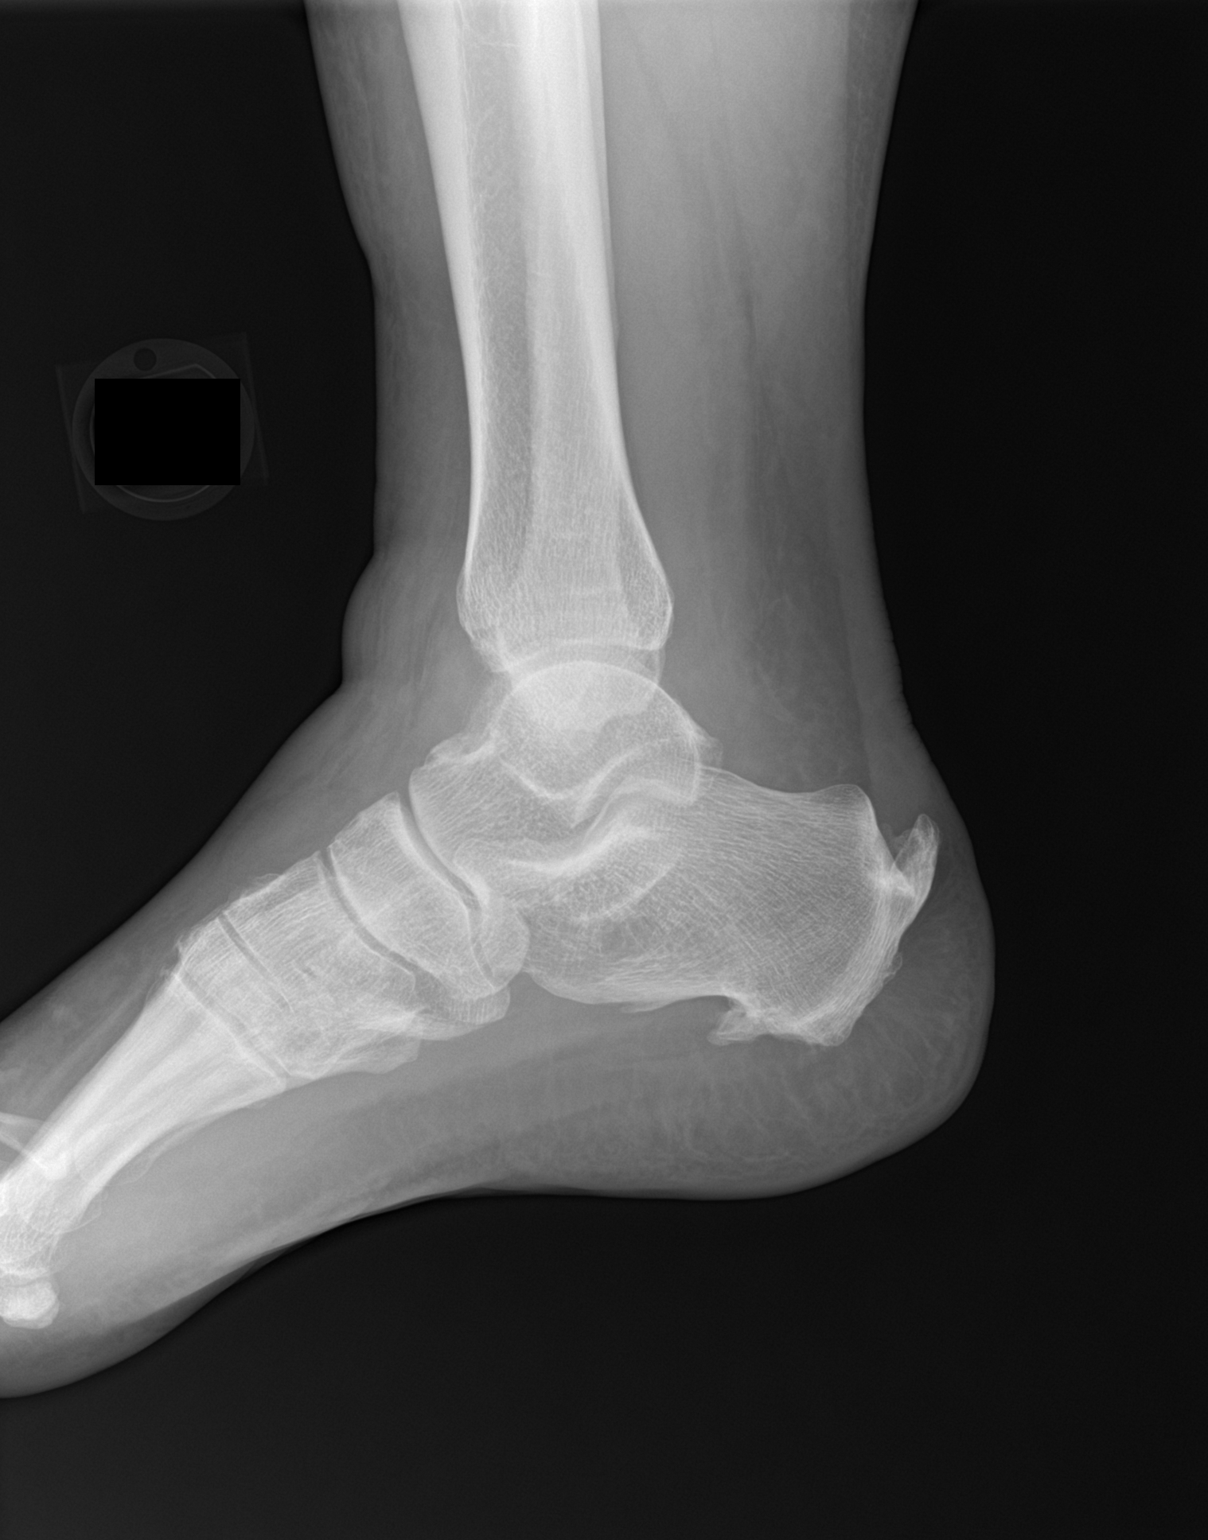

[3 of 3 positions shown; findings below may reference images not displayed]

FINDINGS: There is no evidence of fracture, dislocation, or joint effusion.
There is a large plantar calcaneal spur. Mild degenerative changes
are seen along the dorsal aspect of the proximal to mid right foot.
Moderate to marked severity anterior and lateral soft tissue
swelling is seen.
IMPRESSION: 1. Moderate to marked severity anterior and lateral soft tissue
swelling without evidence of acute fracture.
2. Mild degenerative changes, as described above.
